# Patient Record
Sex: Female | Born: 1990 | Race: Black or African American | Hispanic: No | Marital: Single | Smoking: Never smoker
Health system: Southern US, Community
[De-identification: ages and names within clinical notes are randomized; demographics above are authoritative.]

## PROBLEM LIST (undated history)

## (undated) ENCOUNTER — Emergency Department (HOSPITAL_COMMUNITY): Admission: EM | Discharge status: Absent without leave | Payer: Self-pay

---

## 2013-03-10 ENCOUNTER — Encounter (HOSPITAL_COMMUNITY): Payer: Self-pay | Admitting: Emergency Medicine

## 2013-03-10 ENCOUNTER — Emergency Department (INDEPENDENT_AMBULATORY_CARE_PROVIDER_SITE_OTHER)
Admission: EM | Admit: 2013-03-10 | Discharge: 2013-03-10 | Disposition: A | Discharge status: Home / Self Care | Payer: Self-pay | Source: Home / Self Care | Attending: Family Medicine | Admitting: Family Medicine

## 2013-03-10 ENCOUNTER — Emergency Department (INDEPENDENT_AMBULATORY_CARE_PROVIDER_SITE_OTHER): Payer: Self-pay

## 2013-03-10 DIAGNOSIS — M549 Dorsalgia, unspecified: Secondary | ICD-10-CM

## 2013-03-10 MED ORDER — DICLOFENAC SODIUM 50 MG PO TBEC: 50.0000 mg | DELAYED_RELEASE_TABLET | Freq: Two times a day (BID) | ORAL | Status: DC | PRN

## 2013-03-10 MED ORDER — CYCLOBENZAPRINE HCL 5 MG PO TABS: 5.0000 mg | ORAL_TABLET | Freq: Every evening | ORAL | Status: DC | PRN
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
## 2013-03-10 NOTE — Discharge Instructions (Signed)
Thank you for coming in today. Use a heating pad.  Take diclofenac twice daily as needed for pain. Use Flexeril at bedtime as needed for pain. You will hurt for a few days Come back or go to the emergency room if you notice new weakness new numbness problems walking or bowel or bladder problems.  Cervical Sprain A cervical sprain is an injury in the neck in which the strong, fibrous tissues (ligaments) that connect your neck bones stretch or tear. Cervical sprains can range from mild to severe. Severe cervical sprains can cause the neck vertebrae to be unstable. This can lead to damage of the spinal cord and can result in serious nervous system problems. The amount of time it takes for a cervical sprain to get better depends on the cause and extent of the injury. Most cervical sprains heal in 1 to 3 weeks. CAUSES  Severe cervical sprains may be caused by:   Contact sport injuries (such as from football, rugby, wrestling, hockey, auto racing, gymnastics, diving, martial arts, or boxing).   Motor vehicle collisions.   Whiplash injuries. This is an injury from a sudden forward-and backward whipping movement of the head and neck.  Falls.  Mild cervical sprains may be caused by:   Being in an awkward position, such as while cradling a telephone between your ear and shoulder.   Sitting in a chair that does not offer proper support.   Working at a poorly Marketing executivedesigned computer station.   Looking up or down for long periods of time.  SYMPTOMS   Pain, soreness, stiffness, or a burning sensation in the front, back, or sides of the neck. This discomfort may develop immediately after the injury or slowly, 24 hours or more after the injury.   Pain or tenderness directly in the middle of the back of the neck.   Shoulder or upper back pain.   Limited ability to move the neck.   Headache.   Dizziness.   Weakness, numbness, or tingling in the hands or arms.   Muscle spasms.    Difficulty swallowing or chewing.   Tenderness and swelling of the neck.  DIAGNOSIS  Most of the time your health care provider can diagnose a cervical sprain by taking your history and doing a physical exam. Your health care provider will ask about previous neck injuries and any known neck problems, such as arthritis in the neck. X-rays may be taken to find out if there are any other problems, such as with the bones of the neck. Other tests, such as a CT scan or MRI, may also be needed.  TREATMENT  Treatment depends on the severity of the cervical sprain. Mild sprains can be treated with rest, keeping the neck in place (immobilization), and pain medicines. Severe cervical sprains are immediately immobilized. Further treatment is done to help with pain, muscle spasms, and other symptoms and may include:  Medicines, such as pain relievers, numbing medicines, or muscle relaxants.   Physical therapy. This may involve stretching exercises, strengthening exercises, and posture training. Exercises and improved posture can help stabilize the neck, strengthen muscles, and help stop symptoms from returning.  HOME CARE INSTRUCTIONS   Put ice on the injured area.   Put ice in a plastic bag.   Place a towel between your skin and the bag.   Leave the ice on for 15 20 minutes, 3 4 times a day.   If your injury was severe, you may have been given a cervical collar  to wear. A cervical collar is a two-piece collar designed to keep your neck from moving while it heals.  Do not remove the collar unless instructed by your health care provider.  If you have long hair, keep it outside of the collar.  Ask your health care provider before making any adjustments to your collar. Minor adjustments may be required over time to improve comfort and reduce pressure on your chin or on the back of your head.  Ifyou are allowed to remove the collar for cleaning or bathing, follow your health care provider's  instructions on how to do so safely.  Keep your collar clean by wiping it with mild soap and water and drying it completely. If the collar you have been given includes removable pads, remove them every 1 2 days and hand wash them with soap and water. Allow them to air dry. They should be completely dry before you wear them in the collar.  If you are allowed to remove the collar for cleaning and bathing, wash and dry the skin of your neck. Check your skin for irritation or sores. If you see any, tell your health care provider.  Do not drive while wearing the collar.   Only take over-the-counter or prescription medicines for pain, discomfort, or fever as directed by your health care provider.   Keep all follow-up appointments as directed by your health care provider.   Keep all physical therapy appointments as directed by your health care provider.   Make any needed adjustments to your workstation to promote good posture.   Avoid positions and activities that make your symptoms worse.   Warm up and stretch before being active to help prevent problems.  SEEK MEDICAL CARE IF:   Your pain is not controlled with medicine.   You are unable to decrease your pain medicine over time as planned.   Your activity level is not improving as expected.  SEEK IMMEDIATE MEDICAL CARE IF:   You develop any bleeding.  You develop stomach upset.  You have signs of an allergic reaction to your medicine.   Your symptoms get worse.   You develop new, unexplained symptoms.   You have numbness, tingling, weakness, or paralysis in any part of your body.  MAKE SURE YOU:   Understand these instructions.  Will watch your condition.  Will get help right away if you are not doing well or get worse. Document Released: 11/06/2006 Document Revised: 10/30/2012 Document Reviewed: 07/17/2012 [facility] Patient Information 2014 [place], [place].

## 2015-09-09 IMAGING — CR DG THORACIC SPINE 2V
2 series · 2 of 2 positions shown · non-contrast
Comparison: None.

CLINICAL DATA: Pain post trauma

EXAM:
THORACIC SPINE - 2 VIEW

[view not recorded (1 of 2)]
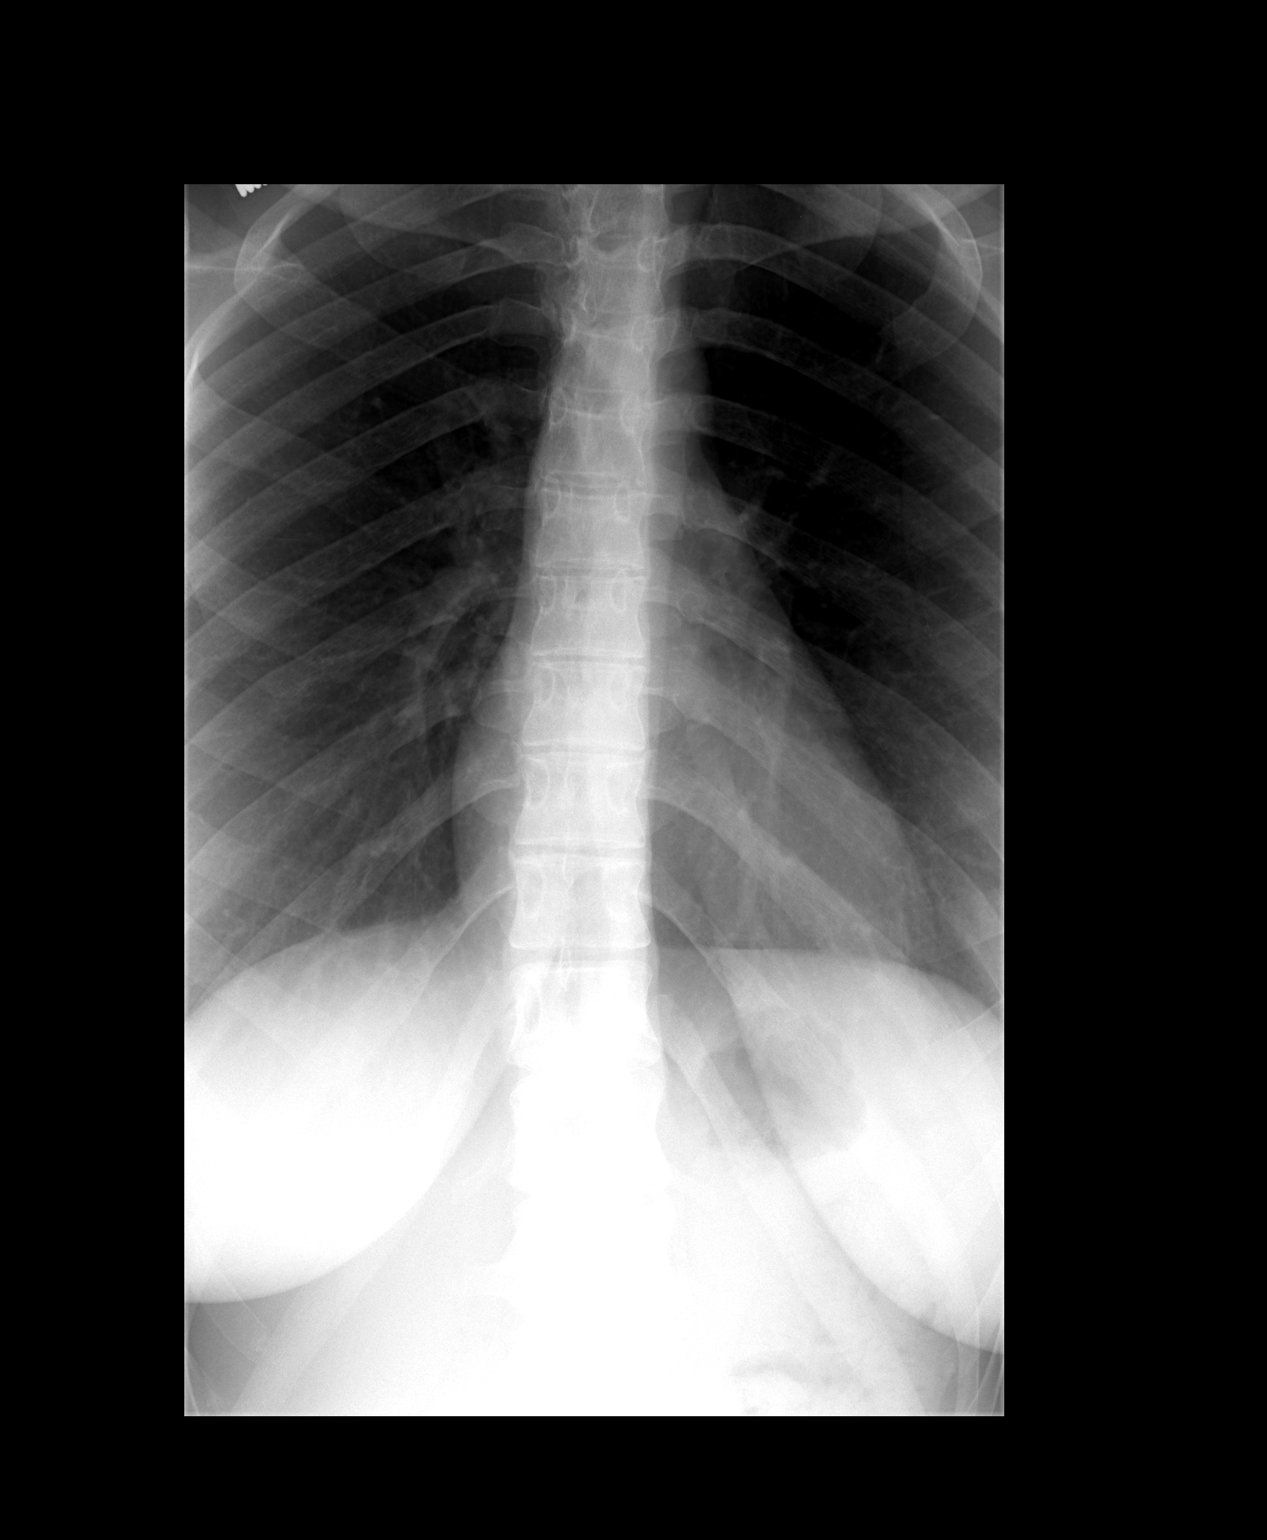

[view not recorded (2 of 2)]
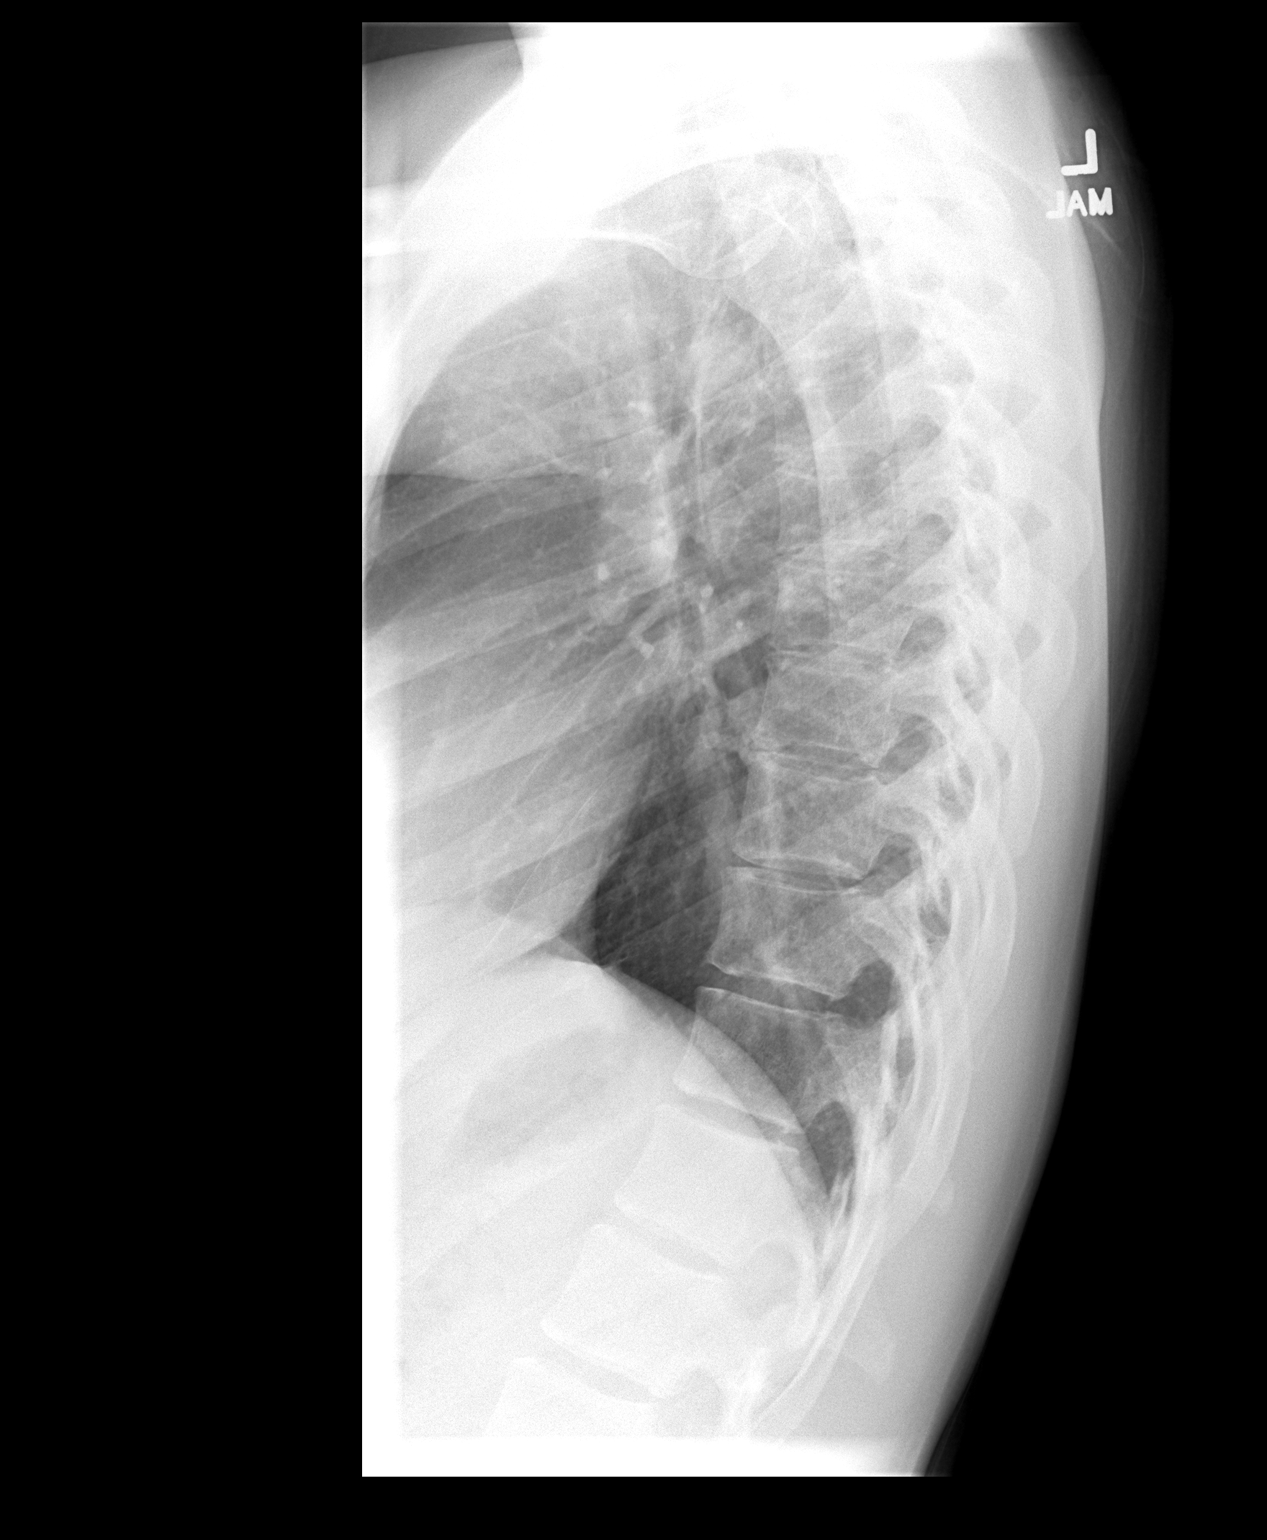

[2 of 2 positions shown; findings below may reference images not displayed]

FINDINGS: Frontal and lateral views were obtained. There is mild thoracolumbar
levorotoscoliosis. There is no fracture or spondylolisthesis. Disc
spaces appear intact.
IMPRESSION: Mild scoliosis.  No fracture or spondylolisthesis.

## 2017-12-18 ENCOUNTER — Ambulatory Visit: Payer: BLUE CROSS/BLUE SHIELD | Admitting: Podiatry

## 2017-12-18 ENCOUNTER — Encounter: Payer: Self-pay | Admitting: Podiatry

## 2017-12-18 DIAGNOSIS — L600 Ingrowing nail: Secondary | ICD-10-CM | POA: Diagnosis not present

## 2017-12-19 NOTE — Progress Notes (Signed)
  Subjective:  Patient ID: Amanda Ortiz, female    DOB: 1990/06/28,  MRN: [id number] HPI Chief Complaint  Patient presents with  . Toe Pain    Hallux left - medial border, tender x few weeks, got infected, went to ER and they cut nail back and Rx'd oral and topical antibiotic, referred here for follow up  . New Patient (Initial Visit)    27 y.o. female presents with the above complaint.   ROS: Denies fever chills nausea vomiting muscle aches pains calf pain back pain chest pain shortness of breath.  No past medical history on file. No past surgical history on file.  Current Outpatient Medications:  .  Levonorgestrel-Ethinyl Estradiol (AMETHIA,CAMRESE) 0.15-0.03 &0.01 MG tablet, TAKE 1 TABLET BY MOUTH DAILY, Disp: , Rfl:  .  mupirocin ointment (BACTROBAN) 2 %, Place 1 application into the nose 2 (two) times daily., Disp: , Rfl:  .  Sulfamethoxazole-Trimethoprim (SULFAMETHOXAZOLE-TMP DS PO), Take by mouth., Disp: , Rfl:   Allergies  Allergen Reactions  . Peanut-Containing Drug Products    Review of Systems Objective:  There were no vitals filed for this visit.  General: Well developed, nourished, in no acute distress, alert and oriented x3   Dermatological: Skin is warm, dry and supple bilateral. Nails x 10 are well maintained; remaining integument appears unremarkable at this time. There are no open sores, no preulcerative lesions, no rash or signs of infection present.  She has recently had an ingrown nail and the granulation tissue along the tibial border of the hallux left appears to be subsiding after what appears to be a partial temporary nail avulsion.  Vascular: Dorsalis Pedis artery and Posterior Tibial artery pedal pulses are 2/4 bilateral with immedate capillary fill time. Pedal hair growth present. No varicosities and no lower extremity edema present bilateral.   Neruologic: Grossly intact via light touch bilateral. Vibratory intact via tuning fork bilateral. Protective  threshold with Semmes Wienstein monofilament intact to all pedal sites bilateral. Patellar and Achilles deep tendon reflexes 2+ bilateral. No Babinski or clonus noted bilateral.   Musculoskeletal: No gross boney pedal deformities bilateral. No pain, crepitus, or limitation noted with foot and ankle range of motion bilateral. Muscular strength 5/5 in all groups tested bilateral.  Gait: Unassisted, Nonantalgic.    Radiographs:  None taken  Assessment & Plan:   Assessment: Ingrown nail tibial border hallux left  Plan: Instructed the patient to continue her antibiotics orally and topically.  Instructed her to soak in Epsom salts and warm water follow-up with me in a week or so for permanent procedure.     Tameisha Covell T. ColemanHyatt, North DakotaDPM

## 2017-12-27 ENCOUNTER — Ambulatory Visit: Payer: BLUE CROSS/BLUE SHIELD | Admitting: Podiatry

## 2018-02-19 ENCOUNTER — Ambulatory Visit (HOSPITAL_COMMUNITY)
Admission: EM | Admit: 2018-02-19 | Discharge: 2018-02-19 | Disposition: A | Discharge status: Home / Self Care | Payer: BLUE CROSS/BLUE SHIELD | Attending: Family Medicine | Admitting: Family Medicine

## 2018-02-19 ENCOUNTER — Encounter (HOSPITAL_COMMUNITY): Payer: Self-pay | Admitting: Emergency Medicine

## 2018-02-19 DIAGNOSIS — N760 Acute vaginitis: Secondary | ICD-10-CM | POA: Insufficient documentation

## 2018-02-19 LAB — POCT URINALYSIS DIP (DEVICE)
Bilirubin Urine: NEGATIVE
Glucose, UA: NEGATIVE mg/dL
Hgb urine dipstick: NEGATIVE
Ketones, ur: NEGATIVE mg/dL
Nitrite: NEGATIVE
Protein, ur: 30 mg/dL — AB
Specific Gravity, Urine: 1.02 (ref 1.005–1.030)
Urobilinogen, UA: 0.2 mg/dL (ref 0.0–1.0)
pH: 7.5 (ref 5.0–8.0)

## 2018-02-19 LAB — POCT PREGNANCY, URINE: Preg Test, Ur: NEGATIVE

## 2018-02-19 MED ORDER — METRONIDAZOLE 500 MG PO TABS: 500.0000 mg | ORAL_TABLET | Freq: Two times a day (BID) | ORAL | 0 refills | Status: AC

## 2018-02-19 NOTE — ED Triage Notes (Signed)
Pt here for vaginal discharge that she thinks could be BV 

## 2018-02-19 NOTE — Discharge Instructions (Signed)
Will start treatment for bacterial vaginosis, begin metronidazole twice daily for the next week.  Do not drink alcohol until 24 hours after the last tablet.  We are testing you for Gonorrhea, Chlamydia, Trichomonas, Yeast and Bacterial Vaginosis. We will call you if anything is positive and let you know if you require any further treatment. Please inform partners of any positive results.   Please return if symptoms not improving with treatment, development of fever, nausea, vomiting, abdominal pain.

## 2018-02-19 NOTE — ED Provider Notes (Signed)
[facility]    CSN: [id number] Arrival date & time: 02/19/18  1653     History   Chief Complaint Chief Complaint  Patient presents with  . Vaginal Discharge    appt 1700    HPI Amanda Ortiz is a 28 y.o. female no significant past medical history presenting today for evaluation of vaginal discharge.  Patient states that over the past 2 to 3 days she has had some mild irritation in the vaginal area.  She has had some occasional itching.  She is also noticed an odor and a thin discharge.  She denies any dysuria or increased in frequency.  Denies abdominal pain, nausea or vomiting.  Has had BV once previously and believes her symptoms may be due to this.  Has had a new partner but no high concerns for STDs.  HPI  History reviewed. No pertinent past medical history.  There are no active problems to display for this patient.   History reviewed. No pertinent surgical history.  OB History   No obstetric history on file.      Home Medications    Prior to Admission medications   Medication Sig Start Date End Date Taking? Authorizing Provider  Levonorgestrel-Ethinyl Estradiol (AMETHIA,CAMRESE) 0.15-0.03 &0.01 MG tablet TAKE 1 TABLET BY MOUTH DAILY 02/09/17   [provider]  metroNIDAZOLE (FLAGYL) 500 MG tablet Take 1 tablet (500 mg total) by mouth 2 (two) times daily for 7 days. 02/19/18 02/26/18  Chanci Ojala C, PA-C  mupirocin ointment (BACTROBAN) 2 % Place 1 application into the nose 2 (two) times daily.    [provider]  Sulfamethoxazole-Trimethoprim (SULFAMETHOXAZOLE-TMP DS PO) Take by mouth.    [provider]    Family History Family History  Problem Relation Age of Onset  . Healthy Mother   . Healthy Father     Social History Social History   Tobacco Use  . Smoking status: Never Smoker  . Smokeless tobacco: Never Used  Substance Use Topics  . Alcohol use: Not Currently  . Drug use: Never     Allergies     Peanut-containing drug products   Review of Systems Review of Systems  Constitutional: Negative for fever.  Respiratory: Negative for shortness of breath.   Cardiovascular: Negative for chest pain.  Gastrointestinal: Negative for abdominal pain, diarrhea, nausea and vomiting.  Genitourinary: Positive for vaginal discharge. Negative for dysuria, flank pain, genital sores, hematuria, menstrual problem, vaginal bleeding and vaginal pain.  Musculoskeletal: Negative for back pain.  Skin: Negative for rash.  Neurological: Negative for dizziness, light-headedness and headaches.     Physical Exam Triage Vital Signs ED Triage Vitals  Enc Vitals Group     BP 02/19/18 1722 134/86     Pulse Rate 02/19/18 1722 80     Resp 02/19/18 1722 18     Temp 02/19/18 1722 98.6 F (37 C)     Temp Source 02/19/18 1722 Oral     SpO2 02/19/18 1722 100 %     Weight --      Height --      Head Circumference --      Peak Flow --      Pain Score 02/19/18 1743 0     Pain Loc --      Pain Edu? --      Excl. in GC? --    No data found.  Updated Vital Signs BP 134/86 (BP Location: Left Arm)   Pulse 80   Temp 98.6  F (37 C) (Oral)   Resp 18   SpO2 100%   Visual Acuity Right Eye Distance:   Left Eye Distance:   Bilateral Distance:    Right Eye Near:   Left Eye Near:    Bilateral Near:     Physical Exam Vitals signs and nursing note reviewed.  Constitutional:      General: She is not in acute distress.    Appearance: She is well-developed.  HENT:     Head: Normocephalic and atraumatic.  Eyes:     Conjunctiva/sclera: Conjunctivae normal.  Neck:     Musculoskeletal: Neck supple.  Cardiovascular:     Rate and Rhythm: Normal rate and regular rhythm.     Heart sounds: No murmur.  Pulmonary:     Effort: Pulmonary effort is normal. No respiratory distress.     Breath sounds: Normal breath sounds.  Abdominal:     Palpations: Abdomen is soft.     Tenderness: There is no abdominal  tenderness.     Comments: Nontender light deep palpation throughout abdomen  Skin:    General: Skin is warm and dry.  Neurological:     Mental Status: She is alert.      UC Treatments / Results  Labs (all labs ordered are listed, but only abnormal results are displayed) Labs Reviewed  POC URINE PREG, ED  CERVICOVAGINAL ANCILLARY ONLY    EKG None  Radiology No results found.  Procedures Procedures (including critical care time)  Medications Ordered in UC Medications - No data to display  Initial Impression / Assessment and Plan / UC Course  I have reviewed the triage vital signs and the nursing notes.  Pertinent labs & imaging results that were available during my care of the patient were reviewed by me and considered in my medical decision making (see chart for details).     Pregnancy negative, trace leuks on exam, will send off for culture.  Swab obtained, will send off to check for STDs and yeast and BV.  Will initiate treatment for BV today with metronidazole twice daily x7 days.  Will call patient with results and alter treatment if needed.Discussed strict return precautions. Patient verbalized understanding and is agreeable with plan.  Final Clinical Impressions(s) / UC Diagnoses   Final diagnoses:  Acute vaginitis     Discharge Instructions     Will start treatment for bacterial vaginosis, begin metronidazole twice daily for the next week.  Do not drink alcohol until 24 hours after the last tablet.  We are testing you for Gonorrhea, Chlamydia, Trichomonas, Yeast and Bacterial Vaginosis. We will call you if anything is positive and let you know if you require any further treatment. Please inform partners of any positive results.   Please return if symptoms not improving with treatment, development of fever, nausea, vomiting, abdominal pain.    ED Prescriptions    Medication Sig Dispense Auth. Provider   metroNIDAZOLE (FLAGYL) 500 MG tablet Take 1 tablet  (500 mg total) by mouth 2 (two) times daily for 7 days. 14 tablet Anthonny Schiller, McRobertsHallie C, PA-C     Controlled Substance Prescriptions [place] Controlled Substance Registry consulted? Not Applicable   Lew DawesWieters, Malacki Mcphearson C, [place] JerseyPA-C 02/19/18 1805

## 2018-02-20 LAB — CERVICOVAGINAL ANCILLARY ONLY
Bacterial vaginitis: POSITIVE — AB
Candida vaginitis: POSITIVE — AB
Chlamydia: NEGATIVE
Neisseria Gonorrhea: NEGATIVE
TRICH (WINDOWPATH): NEGATIVE

## 2018-02-21 LAB — URINE CULTURE

## 2018-02-22 ENCOUNTER — Telehealth (HOSPITAL_COMMUNITY): Payer: Self-pay | Admitting: Emergency Medicine

## 2018-02-22 MED ORDER — FLUCONAZOLE 150 MG PO TABS: 150.0000 mg | ORAL_TABLET | Freq: Once | ORAL | 0 refills | Status: AC

## 2018-02-22 NOTE — Telephone Encounter (Signed)
Bacterial Vaginosis test is positive.  Prescription for metronidazole was given at the urgent care visit.  Test for candida (yeast) was positive.  Prescription for fluconazole 150mg po now, repeat dose in 3d if needed, #2 no refills, sent to the pharmacy of record.  Recheck or followup with PCP for further evaluation if symptoms are not improving.    Attempted to reach patient. No answer at this time. Voicemail left.    

## 2018-02-22 NOTE — Telephone Encounter (Signed)
Patient contacted and made aware of all results, all questions answered.   

## 2019-05-06 DIAGNOSIS — Z20828 Contact with and (suspected) exposure to other viral communicable diseases: Secondary | ICD-10-CM | POA: Diagnosis not present

## 2019-05-06 DIAGNOSIS — Z03818 Encounter for observation for suspected exposure to other biological agents ruled out: Secondary | ICD-10-CM | POA: Diagnosis not present

## 2019-05-23 DIAGNOSIS — Z20828 Contact with and (suspected) exposure to other viral communicable diseases: Secondary | ICD-10-CM | POA: Diagnosis not present

## 2019-05-23 DIAGNOSIS — Z03818 Encounter for observation for suspected exposure to other biological agents ruled out: Secondary | ICD-10-CM | POA: Diagnosis not present

## 2019-06-09 DIAGNOSIS — Z111 Encounter for screening for respiratory tuberculosis: Secondary | ICD-10-CM | POA: Diagnosis not present

## 2019-07-08 DIAGNOSIS — Z01419 Encounter for gynecological examination (general) (routine) without abnormal findings: Secondary | ICD-10-CM | POA: Diagnosis not present

## 2019-07-08 DIAGNOSIS — Z6828 Body mass index (BMI) 28.0-28.9, adult: Secondary | ICD-10-CM | POA: Diagnosis not present

## 2019-07-08 DIAGNOSIS — Z114 Encounter for screening for human immunodeficiency virus [HIV]: Secondary | ICD-10-CM | POA: Diagnosis not present

## 2019-07-08 DIAGNOSIS — Z1159 Encounter for screening for other viral diseases: Secondary | ICD-10-CM | POA: Diagnosis not present

## 2019-07-08 DIAGNOSIS — Z118 Encounter for screening for other infectious and parasitic diseases: Secondary | ICD-10-CM | POA: Diagnosis not present

## 2019-07-08 DIAGNOSIS — Z113 Encounter for screening for infections with a predominantly sexual mode of transmission: Secondary | ICD-10-CM | POA: Diagnosis not present
